# Patient Record
Sex: Female | Born: 2005 | Race: Black or African American | Hispanic: No | Marital: Single | Smoking: Never smoker
Health system: Southern US, Community
[De-identification: ages and names within clinical notes are randomized; demographics above are authoritative.]

---
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
## 2024-03-30 ENCOUNTER — Ambulatory Visit: Payer: Self-pay | Admitting: Family Medicine

## 2024-04-03 ENCOUNTER — Ambulatory Visit: Payer: Self-pay | Admitting: Student
